# Patient Record
Sex: Male | Born: 1995 | Race: White | Hispanic: No | Marital: Single | Smoking: Never smoker
Health system: Southern US, Community
[De-identification: ages and names within clinical notes are randomized; demographics above are authoritative.]

---

## 2019-04-03 ENCOUNTER — Emergency Department: Payer: PRIVATE HEALTH INSURANCE

## 2019-04-03 ENCOUNTER — Other Ambulatory Visit: Payer: Self-pay

## 2019-04-03 ENCOUNTER — Emergency Department
Admission: EM | Admit: 2019-04-03 | Discharge: 2019-04-03 | Disposition: A | Discharge status: Home / Self Care | Payer: PRIVATE HEALTH INSURANCE | Attending: Emergency Medicine | Admitting: Emergency Medicine

## 2019-04-03 DIAGNOSIS — S93104A Unspecified dislocation of right toe(s), initial encounter: Secondary | ICD-10-CM

## 2019-04-03 DIAGNOSIS — Y9366 Activity, soccer: Secondary | ICD-10-CM | POA: Diagnosis not present

## 2019-04-03 DIAGNOSIS — Y998 Other external cause status: Secondary | ICD-10-CM | POA: Diagnosis not present

## 2019-04-03 DIAGNOSIS — W228XXA Striking against or struck by other objects, initial encounter: Secondary | ICD-10-CM | POA: Diagnosis not present

## 2019-04-03 DIAGNOSIS — S99921A Unspecified injury of right foot, initial encounter: Secondary | ICD-10-CM | POA: Diagnosis present

## 2019-04-03 DIAGNOSIS — Y92322 Soccer field as the place of occurrence of the external cause: Secondary | ICD-10-CM | POA: Insufficient documentation

## 2019-04-03 NOTE — ED Provider Notes (Signed)
[facility] Emergency Department Provider Note  ____________________________________________  Time seen: Approximately 7:20 PM  I have reviewed the triage vital signs and the nursing notes.   HISTORY  Chief Complaint Toe Pain    HPI Christopher Evans is a 23 y.o. male who presents the emergency department complaining of pain to the great toe of the right foot.  Patient was playing soccer barefooted with some friends, went to kick the ball at the same time another player did.  Patient reports that he has had pain, swelling to the great toe since injury.  No other injury or complaint.  No medications prior to arrival.         History reviewed. No pertinent past medical history.  There are no active problems to display for this patient.   History reviewed. No pertinent surgical history.  Prior to Admission medications   Not on File    Allergies Amoxicillin  History reviewed. No pertinent family history.  Social History Social History   Tobacco Use  . Smoking status: Never Smoker  Substance Use Topics  . Alcohol use: Yes  . Drug use: Not Currently     Review of Systems  Constitutional: No fever/chills Eyes: No visual changes. No discharge ENT: No upper respiratory complaints. Cardiovascular: no chest pain. Respiratory: no cough. No SOB. Gastrointestinal: No abdominal pain.  No nausea, no vomiting.  No diarrhea.  No constipation. Musculoskeletal: Positive for pain/injury to the great toe right foot Skin: Negative for rash, abrasions, lacerations, ecchymosis. Neurological: Negative for headaches, focal weakness or numbness. 10-point ROS otherwise negative.  ____________________________________________   PHYSICAL EXAM:  VITAL SIGNS: ED Triage Vitals  Enc Vitals Group     BP 04/03/19 1912 (!) 150/85     Pulse Rate 04/03/19 1912 86     Resp 04/03/19 1912 18     Temp 04/03/19 1912 98 F (36.7 C)     Temp Source 04/03/19 1912 Oral      SpO2 04/03/19 1912 100 %     Weight 04/03/19 1911 160 lb (72.6 kg)     Height 04/03/19 1911 5\' 9"  (1.753 m)     Head Circumference --      Peak Flow --      Pain Score 04/03/19 1917 2     Pain Loc --      Pain Edu? --      Excl. in GC? --      Constitutional: Alert and oriented. Well appearing and in no acute distress. Eyes: Conjunctivae are normal. PERRL. EOMI. Head: Atraumatic. Neck: No stridor.    Cardiovascular: Normal rate, regular rhythm. Normal S1 and S2.  Good peripheral circulation. Respiratory: Normal respiratory effort without tachypnea or retractions. Lungs CTAB. Good air entry to the bases with no decreased or absent breath sounds. Musculoskeletal: Full range of motion to all extremities. No gross deformities appreciated.  Visualization of great toe right foot reveals edema when compared with unaffected digit.  No gross deformity.  Patient is able to flex and extend the toe at this time.  Sensation intact.  Capillary refill intact.  Palpation over the interphalangeal joint reveals possible palpable abnormality. Neurologic:  Normal speech and language. No gross focal neurologic deficits are appreciated.  Skin:  Skin is warm, dry and intact. No rash noted. Psychiatric: Mood and affect are normal. Speech and behavior are normal. Patient exhibits appropriate insight and judgement.   ____________________________________________   LABS (all labs ordered are listed, but only abnormal results are displayed)  Labs Reviewed - No data to display ____________________________________________  EKG   ____________________________________________  RADIOLOGY I personally viewed and evaluated these images as part of my medical decision making, as well as reviewing the written report by the radiologist.  I concur with radiologist finding of dislocated great toe interphalangeal joint  Dg Foot Complete Right  Result Date: 04/03/2019 CLINICAL DATA:  Foot injury playing soccer EXAM:  RIGHT FOOT COMPLETE - 3+ VIEW COMPARISON:  None. FINDINGS: There is dislocation of the right great toe IP joint. No visible fracture. Joint spaces otherwise maintained. IMPRESSION: Dislocation at the right great toe IP joint. Electronically Signed   By: Rolm Baptise M.D.   On: 04/03/2019 19:44    ____________________________________________    PROCEDURES  Procedure(s) performed:    Reduction of dislocation  Date/Time: 04/03/2019 8:34 PM Performed by: Darletta Moll, PA-C Authorized by: Darletta Moll, PA-C  Consent: Verbal consent obtained. Risks and benefits: risks, benefits and alternatives were discussed Consent given by: patient Patient understanding: patient states understanding of the procedure being performed Imaging studies: imaging studies available Patient identity confirmed: verbally with patient Time out: Immediately prior to procedure a "time out" was called to verify the correct patient, procedure, equipment, support staff and site/side marked as required. Local anesthesia used: no  Anesthesia: Local anesthesia used: no  Sedation: Patient sedated: no  Patient tolerance: patient tolerated the procedure well with no immediate complications Comments: Stabilization of the proximal phalanx with traction over the distal phalanx and manipulation of the interphalangeal joint.  Good palpable reduction.  Visibly aligned at this time.  Patient's first and second digit were buddy taped together and postop shoe applied.       Medications - No data to display   ____________________________________________   INITIAL IMPRESSION / ASSESSMENT AND PLAN / ED COURSE  Pertinent labs & imaging results that were available during my care of the patient were reviewed by me and considered in my medical decision making (see chart for details).  Review of the [place] CSRS was performed in accordance of the [place] prior to dispensing any controlled drugs.            Patient's diagnosis is consistent with toe dislocation.  Patient presented to the emergency department with an injury to the great toe of the right foot.  On exam, concern for interphalangeal joint injury.  X-ray reveals dislocated interphalangeal joint.  As described above, reduction was performed in the emergency department with good palpable and visible reduction.  At this time no repeat imaging was deemed necessary.  Toe was buddy taped, postop shoe applied.  Tylenol and Motrin at home as needed for pain.  Follow-up with orthopedics if needed. Patient is given ED precautions to return to the ED for any worsening or new symptoms.     ____________________________________________  FINAL CLINICAL IMPRESSION(S) / ED DIAGNOSES  Final diagnoses:  Dislocation of phalanx of right foot, initial encounter      NEW MEDICATIONS STARTED DURING THIS VISIT:  ED Discharge Orders    None          This chart was dictated using voice recognition software/Dragon. Despite best efforts to proofread, errors can occur which can change the meaning. Any change was purely unintentional.    Darletta Moll, PA-C 04/03/19 2040    Carrie Mew, MD 04/03/19 [phone]

## 2019-04-03 NOTE — ED Triage Notes (Signed)
Pt in via POV from home d/t R foot great toe pain post soccer game without shoes. States stubbed toe. Can wiggle toe but states cannot bend it and has intense pain at site. Injured toe slightly darker red than L great toe; no major swelling noted.

## 2021-01-07 IMAGING — DX RIGHT FOOT COMPLETE - 3+ VIEW
3 series · 3 of 3 positions shown · non-contrast
Comparison: None.

CLINICAL DATA: Foot injury playing soccer

EXAM:
RIGHT FOOT COMPLETE - 3+ VIEW

[foot ap]
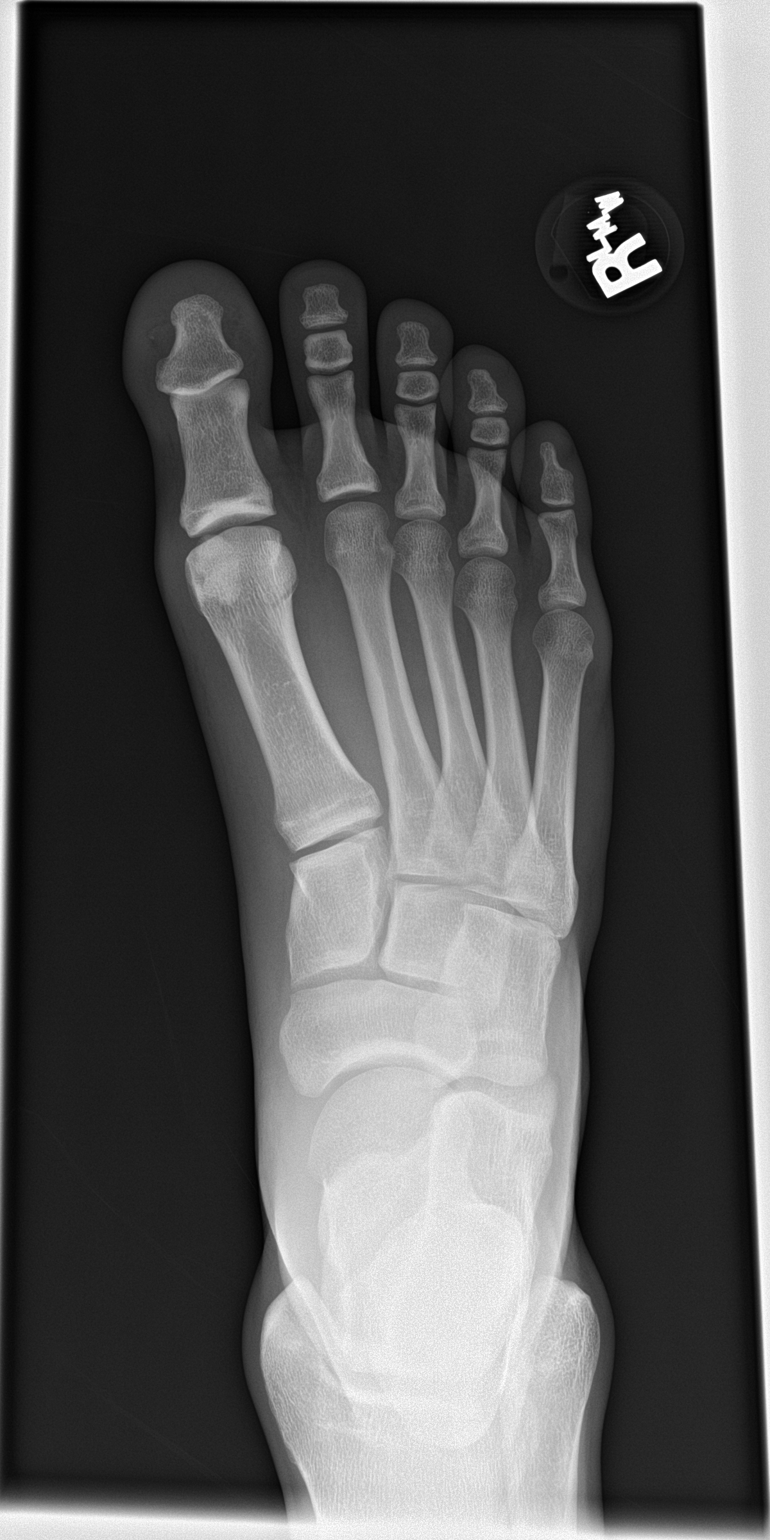

[foot obl]
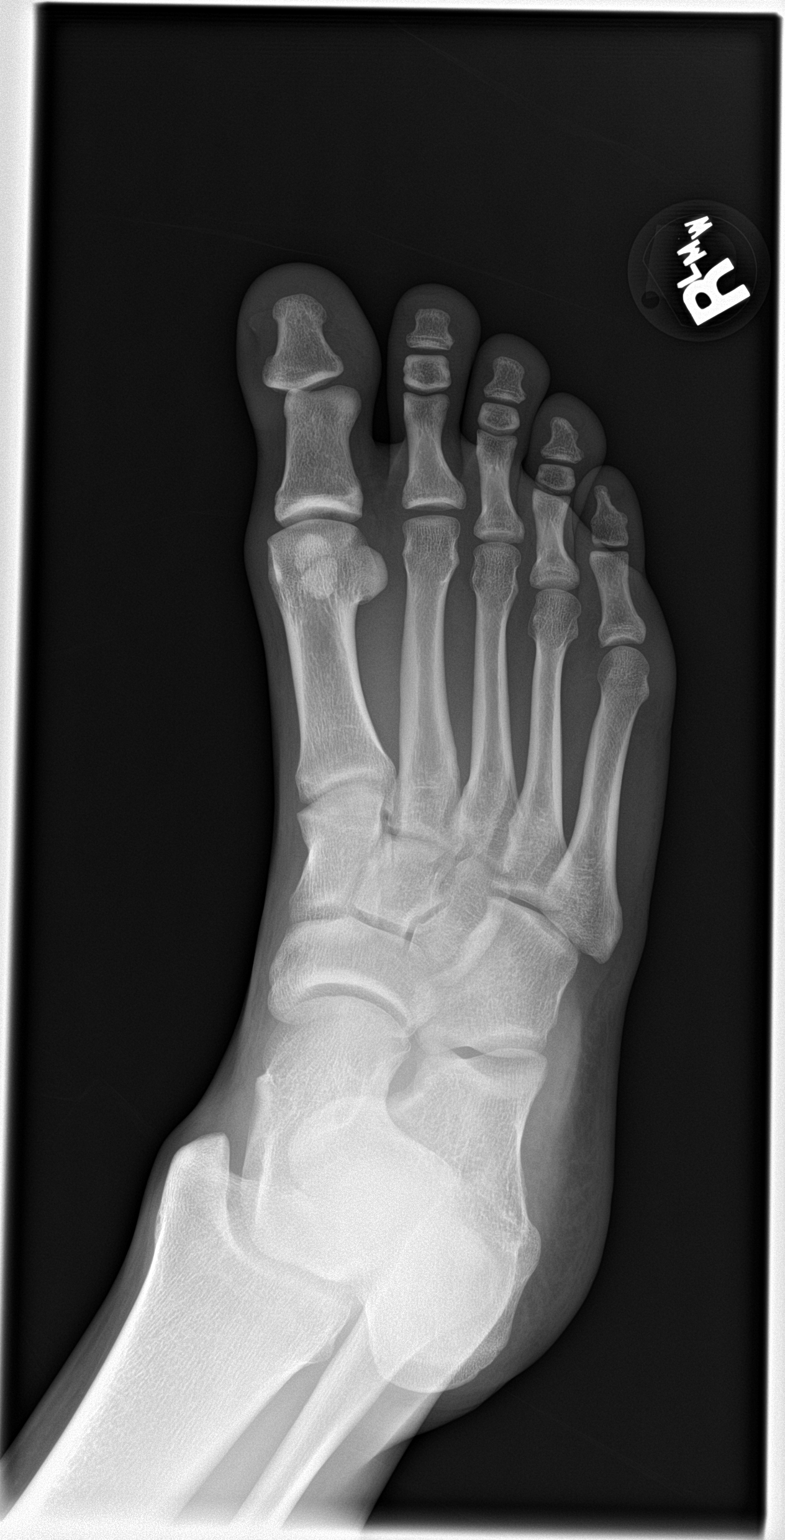

[foot lat]
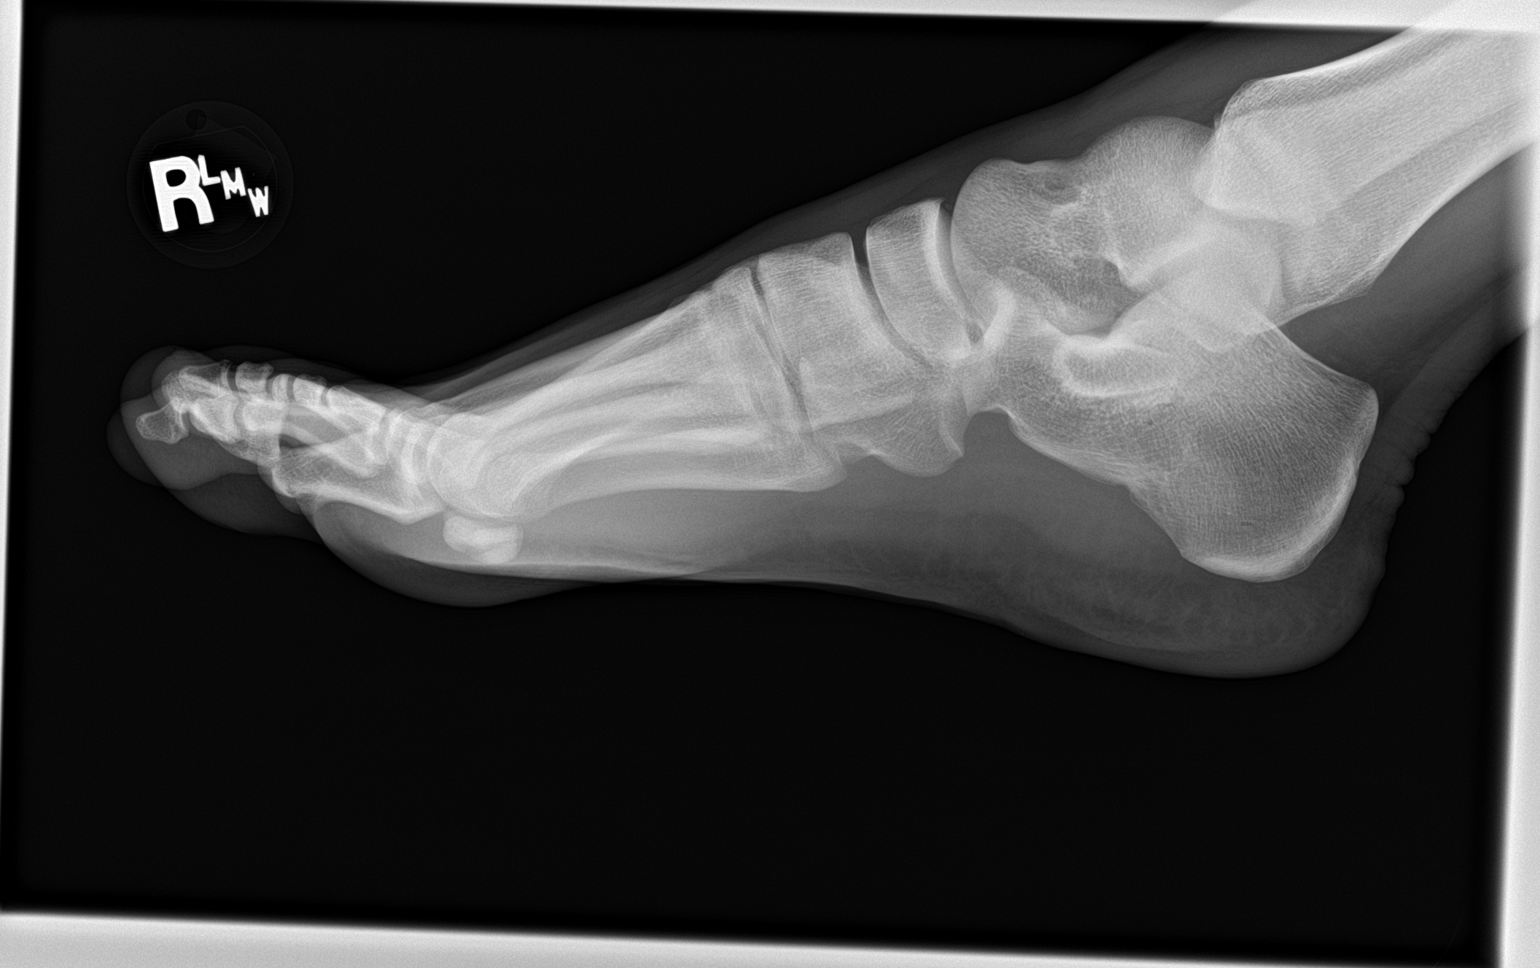

[3 of 3 positions shown; findings below may reference images not displayed]

FINDINGS: There is dislocation of the right great toe IP joint. No visible
fracture. Joint spaces otherwise maintained.
IMPRESSION: Dislocation at the right great toe IP joint.
